# Patient Record
Sex: Female | Born: 1950 | Race: White | Hispanic: No | Marital: Married | Smoking: Never smoker
Health system: Southern US, Community
[De-identification: ages and names within clinical notes are randomized; demographics above are authoritative.]

## PROBLEM LIST (undated history)

## (undated) DIAGNOSIS — M109 Gout, unspecified: Secondary | ICD-10-CM

## (undated) DIAGNOSIS — N289 Disorder of kidney and ureter, unspecified: Secondary | ICD-10-CM

## (undated) DIAGNOSIS — I10 Essential (primary) hypertension: Secondary | ICD-10-CM

---

## 2020-07-06 LAB — EXTERNAL GENERIC LAB PROCEDURE: COLOGUARD: NEGATIVE

## 2021-07-22 ENCOUNTER — Emergency Department (HOSPITAL_BASED_OUTPATIENT_CLINIC_OR_DEPARTMENT_OTHER): Payer: Medicare Other

## 2021-07-22 ENCOUNTER — Encounter (HOSPITAL_BASED_OUTPATIENT_CLINIC_OR_DEPARTMENT_OTHER): Payer: Self-pay | Admitting: Emergency Medicine

## 2021-07-22 ENCOUNTER — Emergency Department (HOSPITAL_BASED_OUTPATIENT_CLINIC_OR_DEPARTMENT_OTHER)
Admission: EM | Admit: 2021-07-22 | Discharge: 2021-07-22 | Discharge status: Against Medical Advice | Payer: Medicare Other | Attending: Emergency Medicine | Admitting: Emergency Medicine

## 2021-07-22 ENCOUNTER — Other Ambulatory Visit: Payer: Self-pay

## 2021-07-22 DIAGNOSIS — Z85828 Personal history of other malignant neoplasm of skin: Secondary | ICD-10-CM | POA: Diagnosis not present

## 2021-07-22 DIAGNOSIS — R0902 Hypoxemia: Secondary | ICD-10-CM

## 2021-07-22 DIAGNOSIS — Z20822 Contact with and (suspected) exposure to covid-19: Secondary | ICD-10-CM | POA: Insufficient documentation

## 2021-07-22 DIAGNOSIS — R531 Weakness: Secondary | ICD-10-CM | POA: Diagnosis present

## 2021-07-22 DIAGNOSIS — R109 Unspecified abdominal pain: Secondary | ICD-10-CM | POA: Diagnosis not present

## 2021-07-22 DIAGNOSIS — R110 Nausea: Secondary | ICD-10-CM

## 2021-07-22 DIAGNOSIS — R112 Nausea with vomiting, unspecified: Secondary | ICD-10-CM | POA: Diagnosis not present

## 2021-07-22 DIAGNOSIS — R509 Fever, unspecified: Secondary | ICD-10-CM | POA: Insufficient documentation

## 2021-07-22 HISTORY — DX: Essential (primary) hypertension: I10

## 2021-07-22 HISTORY — DX: Disorder of kidney and ureter, unspecified: N28.9

## 2021-07-22 HISTORY — DX: Gout, unspecified: M10.9

## 2021-07-22 LAB — I-STAT VENOUS BLOOD GAS, ED
Acid-Base Excess: 8 mmol/L — ABNORMAL HIGH (ref 0.0–2.0)
Bicarbonate: 33.5 mmol/L — ABNORMAL HIGH (ref 20.0–28.0)
Calcium, Ion: 1.22 mmol/L (ref 1.15–1.40)
HCT: 44 % (ref 36.0–46.0)
Hemoglobin: 15 g/dL (ref 12.0–15.0)
O2 Saturation: 84 %
Potassium: 3.7 mmol/L (ref 3.5–5.1)
Sodium: 141 mmol/L (ref 135–145)
TCO2: 35 mmol/L — ABNORMAL HIGH (ref 22–32)
pCO2, Ven: 47.3 mmHg (ref 44–60)
pH, Ven: 7.457 — ABNORMAL HIGH (ref 7.25–7.43)
pO2, Ven: 47 mmHg — ABNORMAL HIGH (ref 32–45)

## 2021-07-22 LAB — COMPREHENSIVE METABOLIC PANEL
ALT: 11 U/L (ref 0–44)
AST: 14 U/L — ABNORMAL LOW (ref 15–41)
Albumin: 4.2 g/dL (ref 3.5–5.0)
Alkaline Phosphatase: 57 U/L (ref 38–126)
Anion gap: 11 (ref 5–15)
BUN: 15 mg/dL (ref 8–23)
CO2: 30 mmol/L (ref 22–32)
Calcium: 9.4 mg/dL (ref 8.9–10.3)
Chloride: 101 mmol/L (ref 98–111)
Creatinine, Ser: 0.75 mg/dL (ref 0.44–1.00)
GFR, Estimated: >60 mL/min (ref >60)
Glucose, Bld: 105 mg/dL — ABNORMAL HIGH (ref 70–99)
Potassium: 3.8 mmol/L (ref 3.5–5.1)
Sodium: 142 mmol/L (ref 135–145)
Total Bilirubin: 0.8 mg/dL (ref 0.3–1.2)
Total Protein: 6.8 g/dL (ref 6.5–8.1)

## 2021-07-22 LAB — CBC WITH DIFFERENTIAL/PLATELET
Abs Immature Granulocytes: 0.01 10*3/uL (ref 0.00–0.07)
Basophils Absolute: 0 10*3/uL (ref 0.0–0.1)
Basophils Relative: 1 %
Eosinophils Absolute: 0 10*3/uL (ref 0.0–0.5)
Eosinophils Relative: 1 %
HCT: 43.3 % (ref 36.0–46.0)
Hemoglobin: 14.5 g/dL (ref 12.0–15.0)
Immature Granulocytes: 0 %
Lymphocytes Relative: 16 %
Lymphs Abs: 0.5 10*3/uL — ABNORMAL LOW (ref 0.7–4.0)
MCH: 31.8 pg (ref 26.0–34.0)
MCHC: 33.5 g/dL (ref 30.0–36.0)
MCV: 95 fL (ref 80.0–100.0)
Monocytes Absolute: 0.4 10*3/uL (ref 0.1–1.0)
Monocytes Relative: 14 %
Neutro Abs: 2.1 10*3/uL (ref 1.7–7.7)
Neutrophils Relative %: 68 %
Platelets: 194 10*3/uL (ref 150–400)
RBC: 4.56 MIL/uL (ref 3.87–5.11)
RDW: 14.2 % (ref 11.5–15.5)
WBC: 3.1 10*3/uL — ABNORMAL LOW (ref 4.0–10.5)
nRBC: 0 % (ref 0.0–0.2)

## 2021-07-22 LAB — LACTIC ACID, PLASMA
Lactic Acid, Venous: 1 mmol/L (ref 0.5–1.9)
Lactic Acid, Venous: 0.7 mmol/L (ref 0.5–1.9)

## 2021-07-22 LAB — RESP PANEL BY RT-PCR (FLU A&B, COVID) ARPGX2
Influenza A by PCR: NEGATIVE
Influenza B by PCR: NEGATIVE
SARS Coronavirus 2 by RT PCR: NEGATIVE

## 2021-07-22 LAB — URINALYSIS, ROUTINE W REFLEX MICROSCOPIC
Bilirubin Urine: NEGATIVE
Glucose, UA: NEGATIVE mg/dL
Hgb urine dipstick: NEGATIVE
Ketones, ur: 15 mg/dL — AB
Leukocytes,Ua: NEGATIVE
Nitrite: NEGATIVE
Protein, ur: NEGATIVE mg/dL
Specific Gravity, Urine: 1.015 (ref 1.005–1.030)
pH: 8.5 — ABNORMAL HIGH (ref 5.0–8.0)

## 2021-07-22 LAB — TROPONIN I (HIGH SENSITIVITY)
Troponin I (High Sensitivity): 12 ng/L (ref <18)
Troponin I (High Sensitivity): 13 ng/L (ref <18)

## 2021-07-22 LAB — LIPASE, BLOOD: Lipase: 30 U/L (ref 11–51)

## 2021-07-22 MED ORDER — ONDANSETRON HCL 4 MG/2ML IJ SOLN
4.0000 mg | Freq: Once | INTRAMUSCULAR | Status: AC
Start: 2021-07-22 — End: 2021-07-22
  Administered 2021-07-22: 4 mg via INTRAVENOUS
  Filled 2021-07-22: qty 2

## 2021-07-22 MED ORDER — IOHEXOL 300 MG/ML  SOLN
100.0000 mL | Freq: Once | INTRAMUSCULAR | Status: AC | PRN
  Administered 2021-07-22: 100 mL via INTRAVENOUS

## 2021-07-22 MED ORDER — KETOROLAC TROMETHAMINE 15 MG/ML IJ SOLN
15.0000 mg | Freq: Once | INTRAMUSCULAR | Status: AC
  Administered 2021-07-22: 15 mg via INTRAVENOUS
  Filled 2021-07-22: qty 1

## 2021-07-22 NOTE — ED Triage Notes (Signed)
Pt reports weakness, n/v, and body aches x 2 days. Pt was instructed to come to ER by PCP.  ?

## 2021-07-22 NOTE — ED Notes (Signed)
Pt advised to consider admission due to need for oxygen today, declines further workup or admission. Dr Wallace Cullens at bedside, explained risks of leaving Against Medical Advice.  Pt continues to request to go home. ?

## 2021-07-22 NOTE — ED Provider Notes (Signed)
?[facility] EMERGENCY DEPARTMENT ?Provider Note ? ? ?CSN: [id number] ?Arrival date & time: 07/22/21  1520 ? ?  ? ?History ? ?Chief Complaint  ?Patient presents with  ? Weakness  ? ? ?Gloria Woodward is a 71 y.o. female. ? ?Pt is a 71 yo female with pmh of hyperlipidemia, htn, and malignant melanoma presenting for complaints of nausea, vomiting, myalgias, temperature of 99.39F x 2 days. Denies coughing, chest pain, sob. Admits to generalized abdominal discomfort. Denies rashes or skin wounds.  ? ?The history is provided by the patient. No language interpreter was used.  ?Weakness ?Associated symptoms: abdominal pain, fever, nausea and vomiting   ?Associated symptoms: no arthralgias, no chest pain, no cough, no dysuria, no seizures and no shortness of breath   ? ?  ? ?Home Medications ?Prior to Admission medications   ?Not on File  ?   ? ?Allergies    ?Penicillins   ? ?Review of Systems   ?Review of Systems  ?Constitutional:  Positive for fever. Negative for chills.  ?HENT:  Negative for ear pain and sore throat.   ?Eyes:  Negative for pain and visual disturbance.  ?Respiratory:  Negative for cough and shortness of breath.   ?Cardiovascular:  Negative for chest pain and palpitations.  ?Gastrointestinal:  Positive for abdominal pain, nausea and vomiting.  ?Genitourinary:  Negative for dysuria and hematuria.  ?Musculoskeletal:  Negative for arthralgias and back pain.  ?Skin:  Negative for color change and rash.  ?Neurological:  Positive for weakness. Negative for seizures and syncope.  ?All other systems reviewed and are negative. ? ?Physical Exam ?Updated Vital Signs ?BP (!) 144/69 (BP Location: Right Arm)   Pulse 85   Temp 98.4 ?F (36.9 ?C) (Oral)   Resp (!) 28   SpO2 (!) 89%  ?Physical Exam ?Vitals and nursing note reviewed.  ?Constitutional:   ?   General: She is not in acute distress. ?   Appearance: She is well-developed.  ?HENT:  ?   Head: Normocephalic and atraumatic.  ?Eyes:  ?   Conjunctiva/sclera:  Conjunctivae normal.  ?Cardiovascular:  ?   Rate and Rhythm: Normal rate and regular rhythm.  ?   Heart sounds: No murmur heard. ?Pulmonary:  ?   Effort: Pulmonary effort is normal. No respiratory distress.  ?   Breath sounds: Normal breath sounds.  ?Abdominal:  ?   Palpations: Abdomen is soft.  ?   Tenderness: There is no abdominal tenderness.  ?Musculoskeletal:     ?   General: No swelling.  ?   Cervical back: Neck supple.  ?Skin: ?   General: Skin is warm and dry.  ?   Capillary Refill: Capillary refill takes less than 2 seconds.  ?Neurological:  ?   Mental Status: She is alert.  ?Psychiatric:     ?   Mood and Affect: Mood normal.  ? ? ?ED Results / Procedures / Treatments   ?Labs ?(all labs ordered are listed, but only abnormal results are displayed) ?Labs Reviewed - No data to display ? ?EKG ?None ? ?Radiology ?No results found. ? ?Procedures ?Procedures  ? ? ?Medications Ordered in ED ?Medications - No data to display ? ?ED Course/ Medical Decision Making/ A&P ?  ?                        ?Medical Decision Making ?Amount and/or Complexity of Data Reviewed ?Labs: ordered. ?Radiology: ordered. ? ?Risk ?Prescription drug management. ? ? ?4:17 PM ?Pt  is a 71 yo female with pmh of hyperlipidemia, htn, and malignant melanoma presenting for complaints of nausea, vomiting, myalgias, temperature of 99.35F x 2 days. ? ?Laboratory studies stable.  No signs or symptoms of sepsis.  No leukocytosis.  UA demonstrates no UTI.  COVID-19 PCR negative.  CT chest abdomen pelvis demonstrates no acute process. ? ?Recommended for admission for hypoxia of unknown cause.  At this time patient denies any smoking history or secondhand smoke exposure.  No wheezing or chest tightness on exam to suggest COPD exacerbation.  No prior history of asthma.  No previous history of PE or DVT.  Denies chest pain or shortness of breath.  CT chest demonstrates no acute process.  Highly recommended for admission for hypoxia, dropping down to 87% while  resting in bed without oxygen.  CT chest completed without PE study, something to consider if hypoxia continues.  ? ?The patient has requested to leave the ED against medical advice. I believe this patient is of sound mind and medical decision making capacity to refuse medical care. The patient is responding and asking questions appropriately. The patient is oriented to person, place and time. The patient is not psychotic, delusional, suicidal, homicidal or hallucinating. The patient demonstrates a normal mental capacity to make decisions regarding their healthcare. The patient is clinically sober and does not appear to be under the influence of any illicit drugs at this time. The patient has been advised of the risks, in layman terms, of leaving AMA which include, but are not limited to death, coma, permanent disability, loss of current lifestyle, delay in diagnosis. Alternatives have been offered - the patient remains steadfast in their wish to leave. The patient has been advised that should they change their mind they are welcome to return to this hospital, or any other, at any time. The patient understands that in no way does an AMA discharge mean that I do not want them to have the best medical care available. To this end, I have offered appropriate prescriptions, referrals, and discharge instructions. The patient did sign AMA paperwork. The above discussion was witnessed by another member of staff. ? ? ? ? ? ? ? ? ?Final Clinical Impression(s) / ED Diagnoses ?Final diagnoses:  ?Hypoxia  ?Nausea  ? ? ?Rx / DC Orders ?ED Discharge Orders   ? ? None  ? ?  ? ? ?  ?Franne Forts, DO ?07/24/21 0305 ? ?

## 2021-07-22 NOTE — Discharge Instructions (Signed)
Today you were recommended for admission for hypoxia occurred while resting.  You are stand that you on a return home at this time, if you change your mind for any reason or if you develop shortness of breath or weakness and fatigue gets worse please return to emergency department immediately.  Would be more than happy to see you again. ?

## 2021-07-22 NOTE — ED Notes (Signed)
SpO2 84% on r/a with good pleth after exertion,  BBS decreased, shallow resp, RR 32. ?Placed on 2lpm O2 SpO2 >95% after. ?

## 2021-07-22 NOTE — ED Notes (Signed)
Patient transported to CT 

## 2021-07-27 LAB — CULTURE, BLOOD (ROUTINE X 2)
Culture: NO GROWTH
Culture: NO GROWTH
Special Requests: ADEQUATE
Special Requests: ADEQUATE

## 2023-02-04 ENCOUNTER — Encounter (HOSPITAL_BASED_OUTPATIENT_CLINIC_OR_DEPARTMENT_OTHER): Payer: Self-pay | Admitting: Emergency Medicine

## 2024-02-09 IMAGING — CT CT CHEST-ABD-PELV W/ CM
2 of 5 series · 14 of 36 positions shown, 16 images · IV contrast (Omnipaque)
Comparison: None.

CLINICAL DATA: Weakness, nausea vomiting, body aches for 2 days,
epigastric pain

EXAM:
CT CHEST, ABDOMEN, AND PELVIS WITH CONTRAST
TECHNIQUE: Multidetector CT imaging of the chest, abdomen and pelvis was
performed following the standard protocol during bolus
administration of intravenous contrast.

[Series 2: cap with 2 · axial · 0.98mm/px · z∈[-604,-128]mm · 11 of 115 slices shown, 13 images]
[im 10/115  mediastinal]
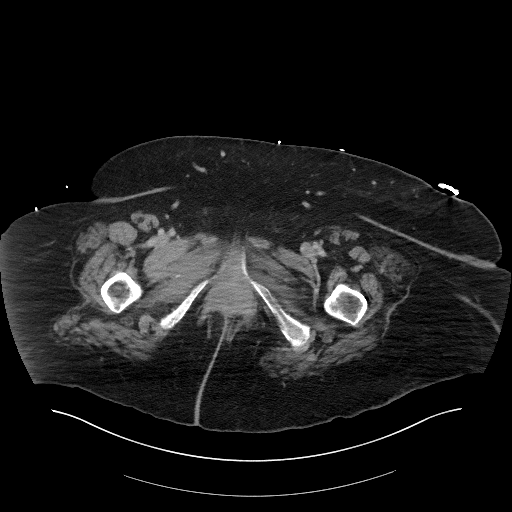
[im 10/115  bone]
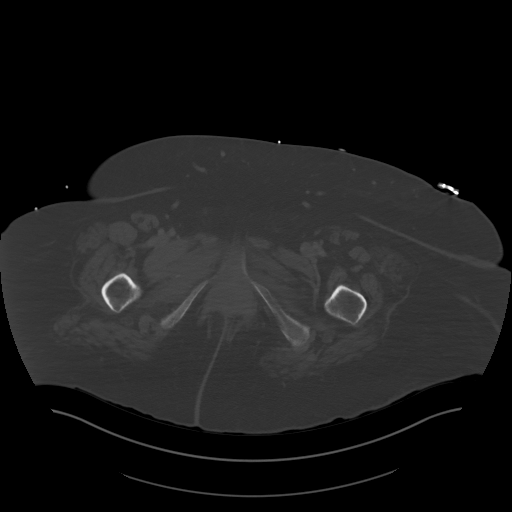
[im 20/115  mediastinal]
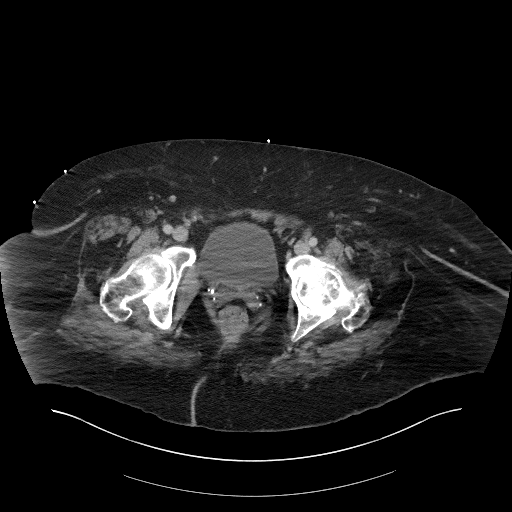
[im 29/115  mediastinal]
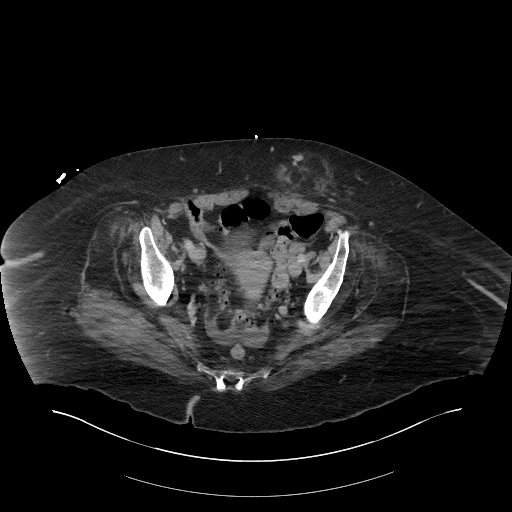
[im 39/115  mediastinal]
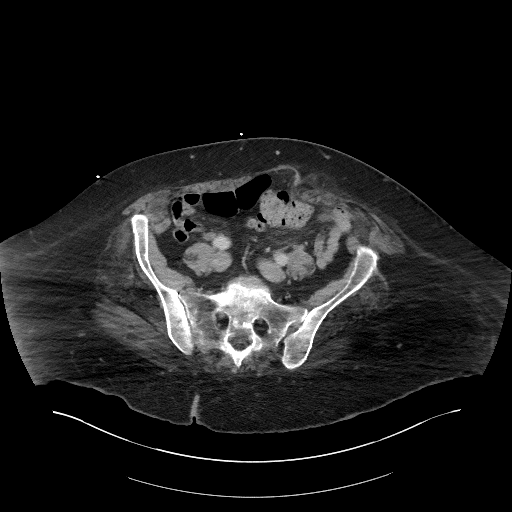
[im 48/115  mediastinal]
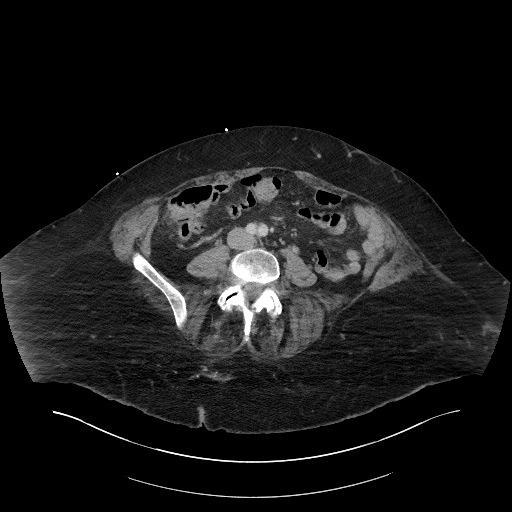
[im 58/115  mediastinal]
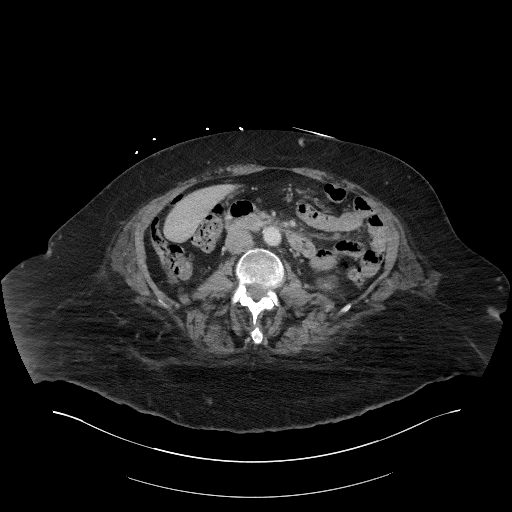
[im 67/115  mediastinal]
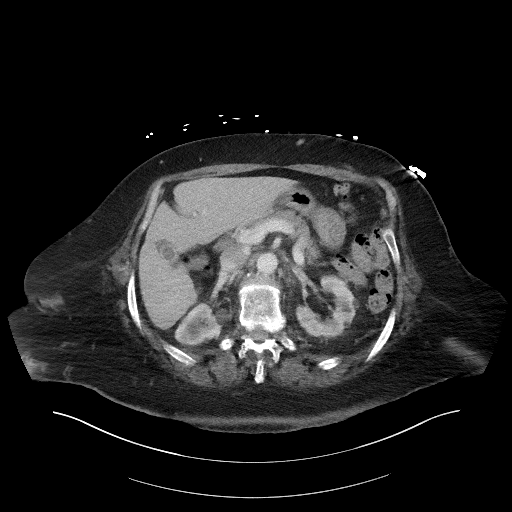
[im 77/115  mediastinal]
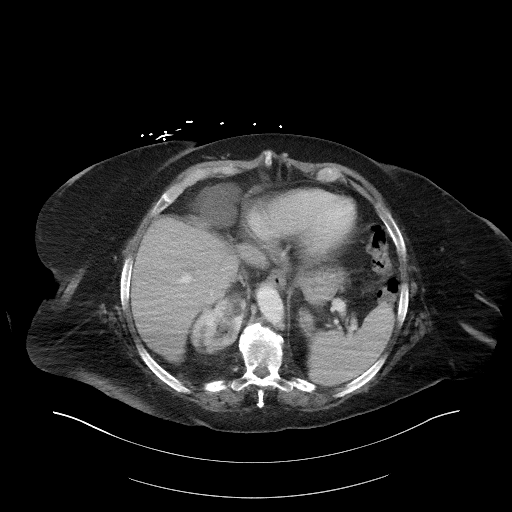
[im 86/115  mediastinal]
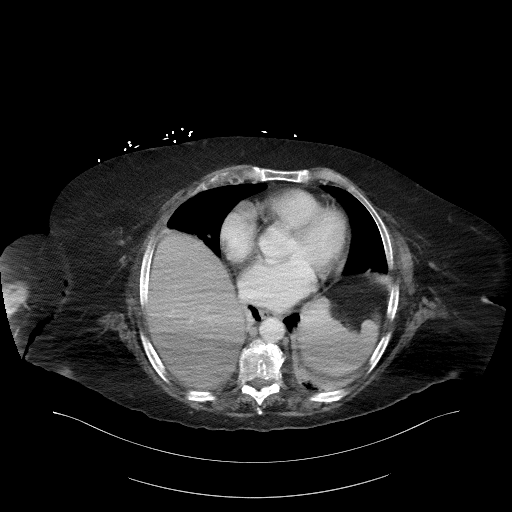
[im 86/115  bone]
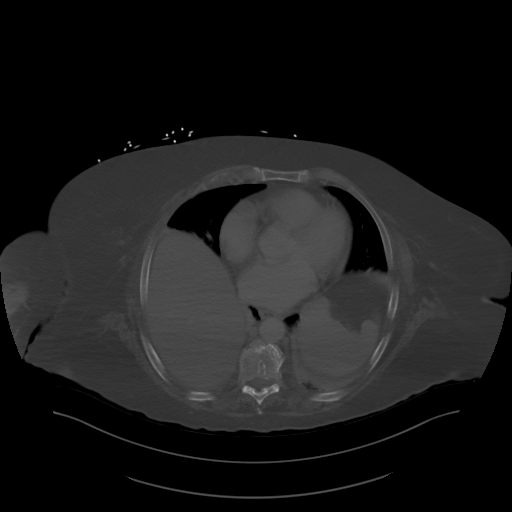
[im 96/115  mediastinal]
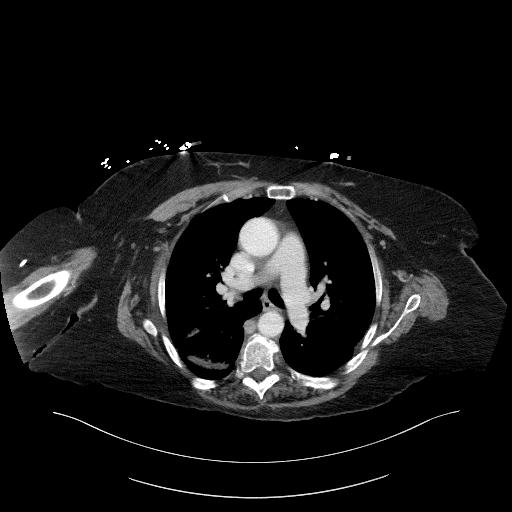
[im 105/115  mediastinal]
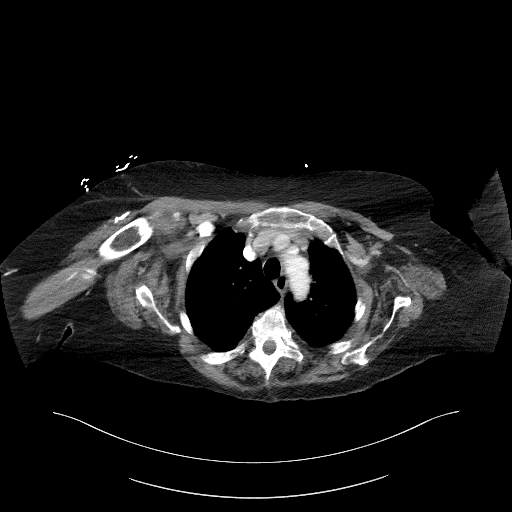

[Series 5: coronals · coronal · 1.05mm/px · 3 of 145 slices shown]
[im 29/145  mediastinal]
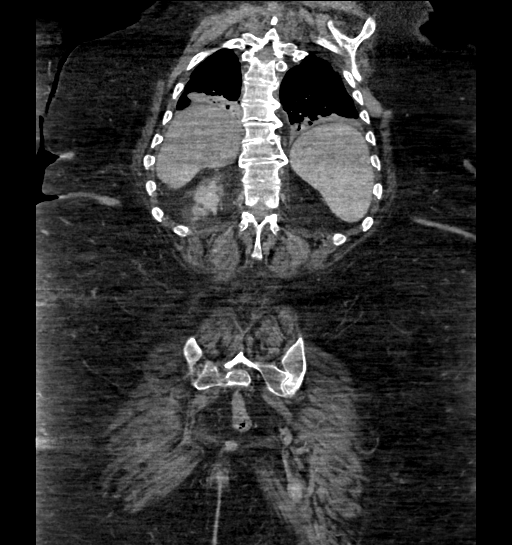
[im 58/145  mediastinal]
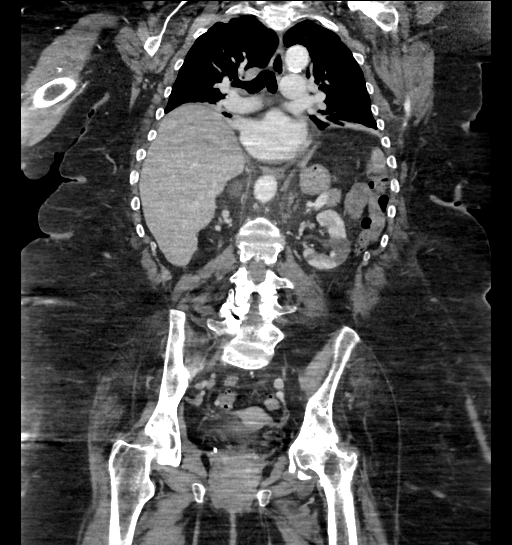
[im 87/145  mediastinal]
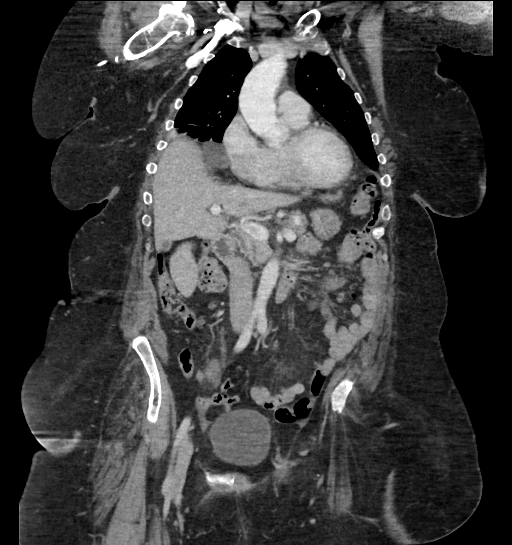

[14 of 36 positions shown; findings below may reference images not displayed]

RADIATION DOSE REDUCTION: This exam was performed according to the
departmental dose-optimization program which includes automated
exposure control, adjustment of the mA and/or kV according to
patient size and/or use of iterative reconstruction technique.

CONTRAST:  100mL OMNIPAQUE IOHEXOL 300 MG/ML  SOLN
FINDINGS: CT CHEST FINDINGS

Cardiovascular: Mild cardiomegaly without pericardial effusion.
Circumscribed lobular simple cyst along the right heart border
measuring 7.5 x 6.2 cm consistent with benign pericardial cyst. No
evidence of thoracic aortic aneurysm or dissection. Minimal
atherosclerosis of the aortic arch.

Mediastinum/Nodes: No enlarged mediastinal, hilar, or axillary lymph
nodes. Thyroid gland, trachea, and esophagus demonstrate no
significant findings.

Lungs/Pleura: Bilateral lower lobe atelectasis, with eventration of
the right hemidiaphragm. No acute airspace disease, effusion, or
pneumothorax. Central airways are patent.

Musculoskeletal: Severe osteoarthritis of the right shoulder, with
marked bony remodeling of the glenoid fossa and anterior subluxation
at the glenohumeral joint. No acute or destructive bony lesions.
Reconstructed images demonstrate no additional findings.

CT ABDOMEN PELVIS FINDINGS

Hepatobiliary: Gallbladder is moderately distended without evidence
of cholelithiasis. Trace pericholecystic fluid is nonspecific. If
cholecystitis is a concern, right upper quadrant ultrasound may be
useful. Liver is unremarkable. No biliary duct dilation.

Pancreas: Unremarkable. No pancreatic ductal dilatation or
surrounding inflammatory changes.

Spleen: Normal in size without focal abnormality.

Adrenals/Urinary Tract: Numerous bilateral renal cortical
hypodensities, the largest of which are compatible with simple
cysts. Other smaller lesions are too small to characterize but
likely reflect benign renal cysts. No specific imaging follow-up is
recommended. The adrenals and bladder are unremarkable.

Stomach/Bowel: No bowel obstruction or ileus. No bowel wall
thickening or inflammatory change. Scattered colonic diverticulosis
without diverticulitis.

Vascular/Lymphatic: Aortic atherosclerosis. No enlarged abdominal or
pelvic lymph nodes.

Reproductive: Uterus and bilateral adnexa are unremarkable.

Other: Trace pelvic free fluid is nonspecific. No free
intraperitoneal gas.

There is a fat containing umbilical hernia, extending into the left
lower quadrant anterior abdominal wall. Hernia sac measures 7.0 x
2.7 by 6.7 cm. The abdominal wall defect measures 2.1 x 1.9 cm. The
herniated mesenteric fat demonstrates mild stranding, consistent
with incarcerated hernia. No bowel herniation.

Musculoskeletal: No acute or destructive bony lesions. Lower lumbar
spondylosis. Severe bilateral hip osteoarthritis, left greater than
right. Reconstructed images demonstrate no additional findings.
IMPRESSION: 1. Incarcerated fat containing umbilical hernia, extending into the
left lower quadrant anterior abdominal wall. No bowel herniation.
2. Trace pericholecystic free fluid, nonspecific. If gallbladder
pathology is suspected, right upper quadrant ultrasound could be
performed.
3. Trace ascites.
4. Colonic diverticulosis without diverticulitis.
5. Severe osteoarthritis of the right shoulder and left hip. No
acute bony abnormalities.
6.  Aortic Atherosclerosis ([id number]).
